# Patient Record
Sex: Male | Born: 1980 | Hispanic: Yes | Marital: Married | Smoking: Never smoker
Health system: Southern US, Community
[De-identification: ages and names within clinical notes are randomized; demographics above are authoritative.]

---

## 2021-05-29 ENCOUNTER — Other Ambulatory Visit: Payer: Self-pay

## 2021-05-29 ENCOUNTER — Encounter: Payer: Self-pay | Admitting: Intensive Care

## 2021-05-29 ENCOUNTER — Emergency Department
Admission: EM | Admit: 2021-05-29 | Discharge: 2021-05-29 | Disposition: A | Discharge status: Home / Self Care | Payer: Self-pay | Attending: Student in an Organized Health Care Education/Training Program | Admitting: Student in an Organized Health Care Education/Training Program

## 2021-05-29 ENCOUNTER — Other Ambulatory Visit: Payer: Self-pay | Admitting: Podiatry

## 2021-05-29 ENCOUNTER — Emergency Department: Payer: Self-pay

## 2021-05-29 DIAGNOSIS — M79672 Pain in left foot: Secondary | ICD-10-CM

## 2021-05-29 DIAGNOSIS — S91332A Puncture wound without foreign body, left foot, initial encounter: Secondary | ICD-10-CM | POA: Insufficient documentation

## 2021-05-29 DIAGNOSIS — Z23 Encounter for immunization: Secondary | ICD-10-CM | POA: Insufficient documentation

## 2021-05-29 DIAGNOSIS — W450XXA Nail entering through skin, initial encounter: Secondary | ICD-10-CM | POA: Insufficient documentation

## 2021-05-29 LAB — COMPREHENSIVE METABOLIC PANEL
ALT: 18 U/L (ref 0–44)
AST: 23 U/L (ref 15–41)
Albumin: 4.2 g/dL (ref 3.5–5.0)
Alkaline Phosphatase: 105 U/L (ref 38–126)
Anion gap: 6 (ref 5–15)
BUN: 18 mg/dL (ref 6–20)
CO2: 29 mmol/L (ref 22–32)
Calcium: 8.9 mg/dL (ref 8.9–10.3)
Chloride: 101 mmol/L (ref 98–111)
Creatinine, Ser: 0.96 mg/dL (ref 0.61–1.24)
GFR, Estimated: >60 mL/min (ref >60)
Glucose, Bld: 110 mg/dL — ABNORMAL HIGH (ref 70–99)
Potassium: 3.8 mmol/L (ref 3.5–5.1)
Sodium: 136 mmol/L (ref 135–145)
Total Bilirubin: 0.5 mg/dL (ref 0.3–1.2)
Total Protein: 8.1 g/dL (ref 6.5–8.1)

## 2021-05-29 LAB — CBC WITH DIFFERENTIAL/PLATELET
Abs Immature Granulocytes: 0.02 10*3/uL (ref 0.00–0.07)
Basophils Absolute: 0.1 10*3/uL (ref 0.0–0.1)
Basophils Relative: 1 %
Eosinophils Absolute: 0.3 10*3/uL (ref 0.0–0.5)
Eosinophils Relative: 4 %
HCT: 42.5 % (ref 39.0–52.0)
Hemoglobin: 14.8 g/dL (ref 13.0–17.0)
Immature Granulocytes: 0 %
Lymphocytes Relative: 32 %
Lymphs Abs: 2.8 10*3/uL (ref 0.7–4.0)
MCH: 30.9 pg (ref 26.0–34.0)
MCHC: 34.8 g/dL (ref 30.0–36.0)
MCV: 88.7 fL (ref 80.0–100.0)
Monocytes Absolute: 0.6 10*3/uL (ref 0.1–1.0)
Monocytes Relative: 7 %
Neutro Abs: 4.9 10*3/uL (ref 1.7–7.7)
Neutrophils Relative %: 56 %
Platelets: 361 10*3/uL (ref 150–400)
RBC: 4.79 MIL/uL (ref 4.22–5.81)
RDW: 11.7 % (ref 11.5–15.5)
WBC: 8.7 10*3/uL (ref 4.0–10.5)
nRBC: 0 % (ref 0.0–0.2)

## 2021-05-29 MED ORDER — DOXYCYCLINE HYCLATE 100 MG PO TABS: 100.0000 mg | ORAL_TABLET | Freq: Two times a day (BID) | ORAL | 0 refills | Status: AC

## 2021-05-29 MED ORDER — OXYCODONE-ACETAMINOPHEN 5-325 MG PO TABS
1.0000 | ORAL_TABLET | ORAL | Status: AC | PRN
  Administered 2021-05-29 (×2): 1 via ORAL
  Filled 2021-05-29 (×2): qty 1

## 2021-05-29 MED ORDER — DOXYCYCLINE HYCLATE 100 MG PO TABS
100.0000 mg | ORAL_TABLET | Freq: Once | ORAL | Status: AC
  Administered 2021-05-29: 100 mg via ORAL
  Filled 2021-05-29: qty 1

## 2021-05-29 MED ORDER — HYDROCODONE-ACETAMINOPHEN 5-325 MG PO TABS: 1.0000 | ORAL_TABLET | ORAL | 0 refills | Status: DC | PRN

## 2021-05-29 MED ORDER — CIPROFLOXACIN HCL 500 MG PO TABS: 500.0000 mg | ORAL_TABLET | Freq: Two times a day (BID) | ORAL | 0 refills | Status: AC

## 2021-05-29 MED ORDER — CIPROFLOXACIN HCL 500 MG PO TABS
500.0000 mg | ORAL_TABLET | Freq: Once | ORAL | Status: AC
  Administered 2021-05-29: 500 mg via ORAL
  Filled 2021-05-29: qty 1

## 2021-05-29 MED ORDER — TETANUS-DIPHTH-ACELL PERTUSSIS 5-2.5-18.5 LF-MCG/0.5 IM SUSY
0.5000 mL | PREFILLED_SYRINGE | Freq: Once | INTRAMUSCULAR | Status: AC
  Administered 2021-05-29: 0.5 mL via INTRAMUSCULAR
  Filled 2021-05-29: qty 0.5

## 2021-05-29 NOTE — ED Notes (Signed)
Interpreting system at bedside. Interpreter request placed as well.

## 2021-05-29 NOTE — ED Provider Notes (Signed)
[facility] Emergency Department Provider Note    Event Date/Time   First MD Initiated Contact with Patient 05/29/21 1834     (approximate)  I have reviewed the triage vital signs and the nursing notes.   HISTORY  Chief Complaint Foot Injury    HPI Stephen Lloyd is a 40 y.o. male presents to the ER for evaluation of several days of persistent left foot pain and swelling that occurred after he had a framing nail go into his foot 10 days ago went through boot.  States he tried to manage at home and actually took a few doses of amoxicillin.  States the pain was more severe a few days ago and is since improved but wanted to be evaluated because he is having persistent swelling.  Denies any fevers or chills no numbness or tingling.  Denies any history of diabetes.  History reviewed. No pertinent past medical history. History reviewed. No pertinent family history. History reviewed. No pertinent surgical history. There are no problems to display for this patient.     Prior to Admission medications   Medication Sig Start Date End Date Taking? Authorizing Provider  ciprofloxacin (CIPRO) 500 MG tablet Take 1 tablet (500 mg total) by mouth 2 (two) times daily for 10 days. 05/29/21 06/08/21 Yes Willy Eddy, MD  doxycycline (VIBRA-TABS) 100 MG tablet Take 1 tablet (100 mg total) by mouth 2 (two) times daily for 10 days. 05/29/21 06/08/21 Yes Willy Eddy, MD  HYDROcodone-acetaminophen (NORCO) 5-325 MG tablet Take 1 tablet by mouth every 4 (four) hours as needed for moderate pain. 05/29/21  Yes Willy Eddy, MD    Allergies Patient has no known allergies.    Social History Social History   Tobacco Use   Smoking status: Never   Smokeless tobacco: Never  Substance Use Topics   Alcohol use: Never   Drug use: Never    Review of Systems Patient denies headaches, rhinorrhea, blurry vision, numbness, shortness of breath, chest pain,  edema, cough, abdominal pain, nausea, vomiting, diarrhea, dysuria, fevers, rashes or hallucinations unless otherwise stated above in HPI. ____________________________________________   PHYSICAL EXAM:  VITAL SIGNS: Vitals:   05/29/21 1855 05/29/21 1930  BP: (!) 113/91 109/87  Pulse: 72 65  Resp: 20   Temp:    SpO2: 98% 100%    Constitutional: Alert and oriented.  Eyes: Conjunctivae are normal.  Head: Atraumatic. Nose: No congestion/rhinnorhea. Mouth/Throat: Mucous membranes are moist.   Neck: No stridor. Painless ROM.  Cardiovascular: Normal rate, regular rhythm. Grossly normal heart sounds.  Good peripheral circulation. Respiratory: Normal respiratory effort.  No retractions. Lungs CTAB. Gastrointestinal: Soft and nontender. No distention. No abdominal bruits. No CVA tenderness. Genitourinary:  Musculoskeletal: Does have swelling and bruising to the midfoot of the left foot.  DP and PT are palpable.  Neurovascular intact distally.  Slightly warm but not overtly erythematous or cellulitic appearing no crepitus.  No blistering.  Does have small puncture wound on the plantar surface of midfoot without any purulence or surrounding erythema.  Neurologic:  Normal speech and language. No gross focal neurologic deficits are appreciated. No facial droop Skin:  Skin is warm, dry and intact. No rash noted. Psychiatric: Mood and affect are normal. Speech and behavior are normal.  ____________________________________________   LABS (all labs ordered are listed, but only abnormal results are displayed)  Results for orders placed or performed during the hospital encounter of 05/29/21 (from the past 24 hour(s))  CBC with Differential  Status: None   Collection Time: 05/29/21  6:09 PM  Result Value Ref Range   WBC 8.7 4.0 - 10.5 K/uL   RBC 4.79 4.22 - 5.81 MIL/uL   Hemoglobin 14.8 13.0 - 17.0 g/dL   HCT 58.5 27.7 - 82.4 %   MCV 88.7 80.0 - 100.0 fL   MCH 30.9 26.0 - 34.0 pg   MCHC 34.8  30.0 - 36.0 g/dL   RDW 23.5 36.1 - 44.3 %   Platelets 361 150 - 400 K/uL   nRBC 0.0 0.0 - 0.2 %   Neutrophils Relative % 56 %   Neutro Abs 4.9 1.7 - 7.7 K/uL   Lymphocytes Relative 32 %   Lymphs Abs 2.8 0.7 - 4.0 K/uL   Monocytes Relative 7 %   Monocytes Absolute 0.6 0.1 - 1.0 K/uL   Eosinophils Relative 4 %   Eosinophils Absolute 0.3 0.0 - 0.5 K/uL   Basophils Relative 1 %   Basophils Absolute 0.1 0.0 - 0.1 K/uL   Immature Granulocytes 0 %   Abs Immature Granulocytes 0.02 0.00 - 0.07 K/uL  Comprehensive metabolic panel     Status: Abnormal   Collection Time: 05/29/21  6:09 PM  Result Value Ref Range   Sodium 136 135 - 145 mmol/L   Potassium 3.8 3.5 - 5.1 mmol/L   Chloride 101 98 - 111 mmol/L   CO2 29 22 - 32 mmol/L   Glucose, Bld 110 (H) 70 - 99 mg/dL   BUN 18 6 - 20 mg/dL   Creatinine, Ser 1.54 0.61 - 1.24 mg/dL   Calcium 8.9 8.9 - 00.8 mg/dL   Total Protein 8.1 6.5 - 8.1 g/dL   Albumin 4.2 3.5 - 5.0 g/dL   AST 23 15 - 41 U/L   ALT 18 0 - 44 U/L   Alkaline Phosphatase 105 38 - 126 U/L   Total Bilirubin 0.5 0.3 - 1.2 mg/dL   GFR, Estimated >67 >61 mL/min   Anion gap 6 5 - 15   ____________________________________________  EKG____________________________________________  RADIOLOGY  I personally reviewed all radiographic images ordered to evaluate for the above acute complaints and reviewed radiology reports and findings.  These findings were personally discussed with the patient.  Please see medical record for radiology report.  ____________________________________________   PROCEDURES  Procedure(s) performed:  Procedures    Critical Care performed: no ____________________________________________   INITIAL IMPRESSION / ASSESSMENT AND PLAN / ED COURSE  Pertinent labs & imaging results that were available during my care of the patient were reviewed by me and considered in my medical decision making (see chart for details).   DDX: Osteo-, cellulitis,  fracture, foreign body  Stephen Lloyd is a 11 y.o. who presents to the ED with presentation as described above.  Patient well-appearing afebrile hemodynamically stable with reassuring blood work 10 days after puncture wound to left foot.  X-ray shows possible osteomyelitis versus bony destruction secondary to traumatic injury to the left foot.  He is not showing any clinical signs of cellulitis or acute infection.  I discussed the case in consultation with podiatry, Dr.Patel.  As he is not septic with no SIRS markers does appear to be a candidate for trial of outpatient management.  Will be placed on antibiotics with pain control.  Discussed strict return precautions and follow-up with podiatry patient agreeable to plan.     The patient was evaluated in Emergency Department today for the symptoms described in the history of present illness. He/she was evaluated  in the context of the global COVID-19 pandemic, which necessitated consideration that the patient might be at risk for infection with the SARS-CoV-2 virus that causes COVID-19. Institutional protocols and algorithms that pertain to the evaluation of patients at risk for COVID-19 are in a state of rapid change based on information released by regulatory bodies including the CDC and federal and state organizations. These policies and algorithms were followed during the patient's care in the ED.  As part of my medical decision making, I reviewed the following data within the electronic MEDICAL RECORD NUMBER Nursing notes reviewed and incorporated, Labs reviewed, notes from prior ED visits and [place] Controlled Substance Database   ____________________________________________   FINAL CLINICAL IMPRESSION(S) / ED DIAGNOSES  Final diagnoses:  Acute foot pain, left  Puncture wound of left foot, initial encounter      NEW MEDICATIONS STARTED DURING THIS VISIT:  New Prescriptions   CIPROFLOXACIN (CIPRO) 500 MG TABLET    Take 1 tablet (500 mg  total) by mouth 2 (two) times daily for 10 days.   DOXYCYCLINE (VIBRA-TABS) 100 MG TABLET    Take 1 tablet (100 mg total) by mouth 2 (two) times daily for 10 days.   HYDROCODONE-ACETAMINOPHEN (NORCO) 5-325 MG TABLET    Take 1 tablet by mouth every 4 (four) hours as needed for moderate pain.     Note:  This document was prepared using Dragon voice recognition software and may include unintentional dictation errors.    Willy Eddy, MD 05/29/21 1949

## 2021-05-29 NOTE — ED Triage Notes (Signed)
Interpretor used during triage. Patient stepped on nail with left foot and went through his shoe X10 days ago. His foot has swelling and some discoloration. Patient reports he took some leftover amoxicillin but it hasnt gotten better. Patient also c/o pain in foot

## 2021-11-01 ENCOUNTER — Other Ambulatory Visit: Payer: Self-pay

## 2021-11-01 ENCOUNTER — Encounter: Payer: Self-pay | Admitting: Emergency Medicine

## 2021-11-01 ENCOUNTER — Emergency Department
Admission: EM | Admit: 2021-11-01 | Discharge: 2021-11-01 | Disposition: A | Discharge status: Home / Self Care | Payer: Self-pay | Attending: Emergency Medicine | Admitting: Emergency Medicine

## 2021-11-01 DIAGNOSIS — L739 Follicular disorder, unspecified: Secondary | ICD-10-CM | POA: Insufficient documentation

## 2021-11-01 DIAGNOSIS — R21 Rash and other nonspecific skin eruption: Secondary | ICD-10-CM

## 2021-11-01 MED ORDER — DOXYCYCLINE HYCLATE 100 MG PO TABS: 100.0000 mg | ORAL_TABLET | Freq: Two times a day (BID) | ORAL | 0 refills | Status: DC

## 2021-11-01 NOTE — ED Provider Notes (Signed)
? ?  [facility] ?Provider Note ? ? ? Event Date/Time  ? First MD Initiated Contact with Patient 11/01/21 [phone]   ?  (approximate) ? ? ?History  ? ?Rash ? ?Spanish interpreter used ?HPI ? ?Stephen Lloyd is a 41 y.o. male with no significant past medical history presents with complaints of a rash.  Patient reports that he frequently gets pimples on his neck along his hairline and on his face.  Occasionally gets them on his back as well.  He reports he took amoxicillin that he got from a [place] which did not help ?  ? ? ?Physical Exam  ? ?Triage Vital Signs: ?ED Triage Vitals  ?Enc Vitals Group  ?   BP 11/01/21 0907 130/80  ?   Pulse Rate 11/01/21 0907 63  ?   Resp 11/01/21 0907 18  ?   Temp 11/01/21 0907 98.7 ?F (37.1 ?C)  ?   Temp Source 11/01/21 0907 Oral  ?   SpO2 11/01/21 0907 97 %  ?   Weight --   ?   Height 11/01/21 0908 1.71 m (5' 7.32")  ?   Head Circumference --   ?   Peak Flow --   ?   Pain Score 11/01/21 0907 8  ?   Pain Loc --   ?   Pain Edu? --   ?   Excl. in [place]? --   ? ? ?Most recent vital signs: ?Vitals:  ? 11/01/21 0907  ?BP: 130/80  ?Pulse: 63  ?Resp: 18  ?Temp: 98.7 ?F (37.1 ?C)  ?SpO2: 97%  ? ? ? ?General: Awake, no distress.  ?CV:  Good peripheral perfusion.  ?Resp:  Normal effort.  ?Abd:  No distention.  ?Other:  Several small pimple/small abscess-like lesions to the face, upper back and hairline of the posterior neck ? ? ?ED Results / Procedures / Treatments  ? ?Labs ?(all labs ordered are listed, but only abnormal results are displayed) ?Labs Reviewed - No data to display ? ? ?EKG ? ? ? ? ?RADIOLOGY ? ? ? ? ?PROCEDURES: ? ?Critical Care performed:  ? ?Procedures ? ? ?MEDICATIONS ORDERED IN ED: ?Medications - No data to display ? ? ?IMPRESSION / MDM / ASSESSMENT AND PLAN / ED COURSE  ?I reviewed the triage vital signs and the nursing notes. ? ? ?I suspect folliculitis based on exam.  We will start the patient on 10 days of doxycycline to see if this helps,  outpatient follow-up with PCP recommended. ? ? ? ? ?  ? ? ?FINAL CLINICAL IMPRESSION(S) / ED DIAGNOSES  ? ?Final diagnoses:  ?Rash  ?Folliculitis  ? ? ? ?Rx / DC Orders  ? ?ED Discharge Orders   ? ?      Ordered  ?  doxycycline (VIBRA-TABS) 100 MG tablet  2 times daily       ? 11/01/21 0936  ? ?  ?  ? ?  ? ? ? ?Note:  This document was prepared using Dragon voice recognition software and may include unintentional dictation errors. ?  ?Lavonia Drafts, MD ?11/01/21 1033 ? ?

## 2021-11-01 NOTE — ED Triage Notes (Signed)
Pt to ED via POV with c/o rash to face and neck for the last 5 months they have been coming and going. It does itching.  ?

## 2023-02-23 IMAGING — DX DG FOOT COMPLETE 3+V*L*
3 series · 3 of 3 positions shown · non-contrast
Comparison: None.

CLINICAL DATA: injury/swelling/discoloration. Stepped on nail 10
days ago.

EXAM:
LEFT FOOT - COMPLETE 3+ VIEW

[foot ap]
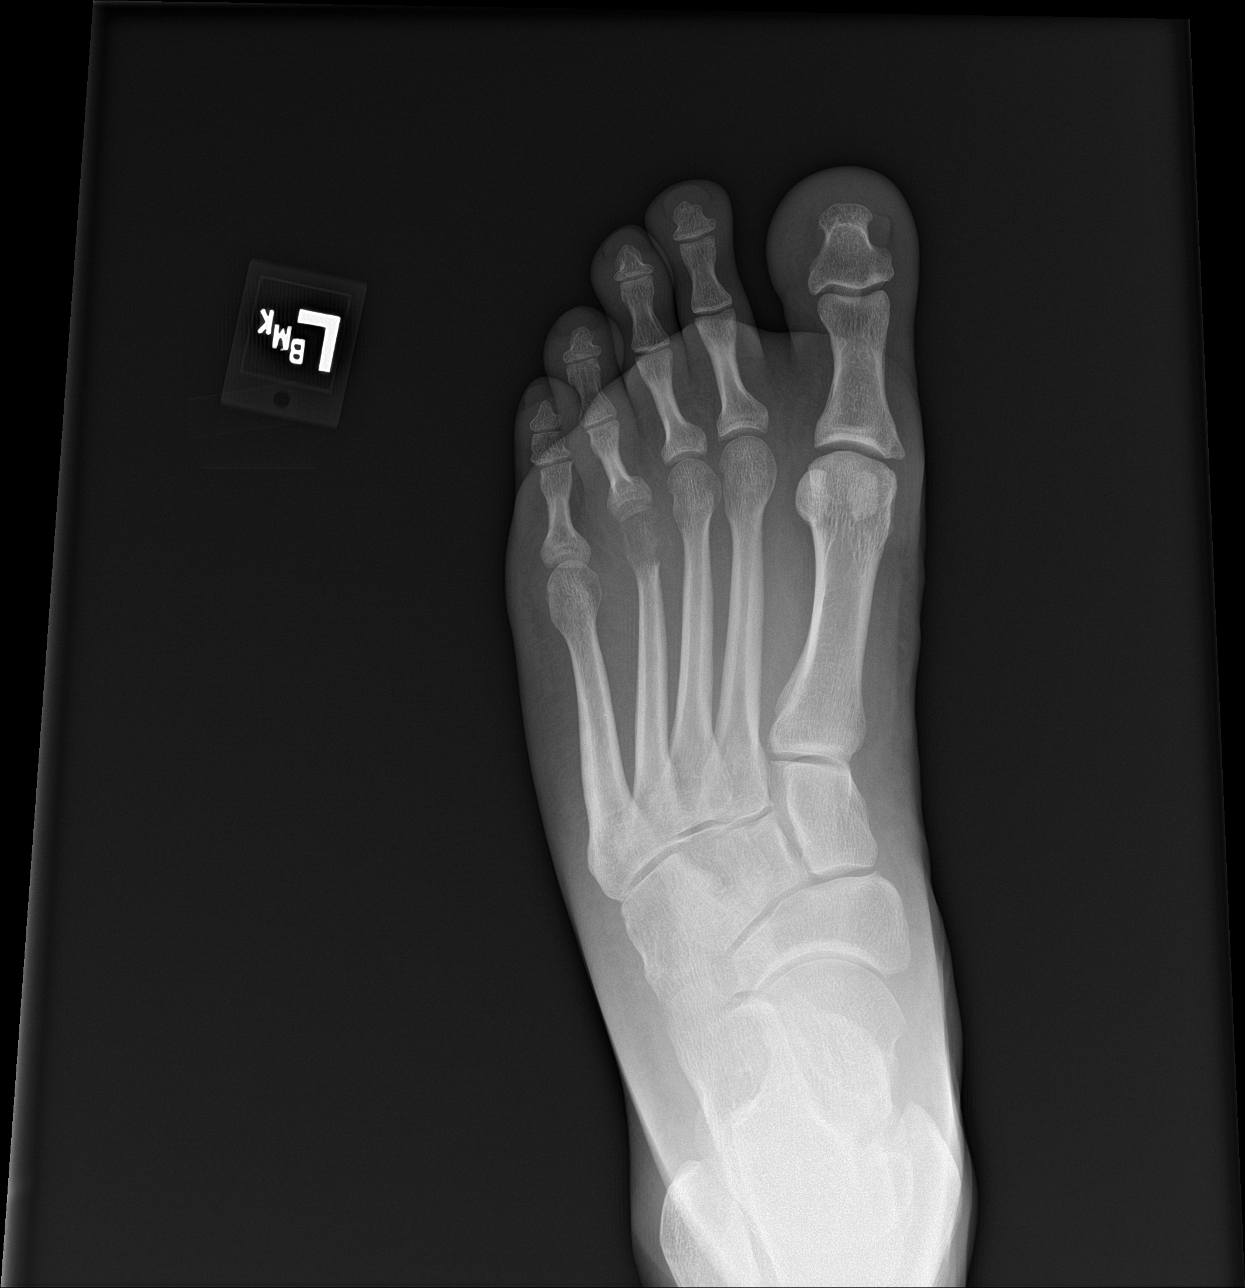

[foot obl]
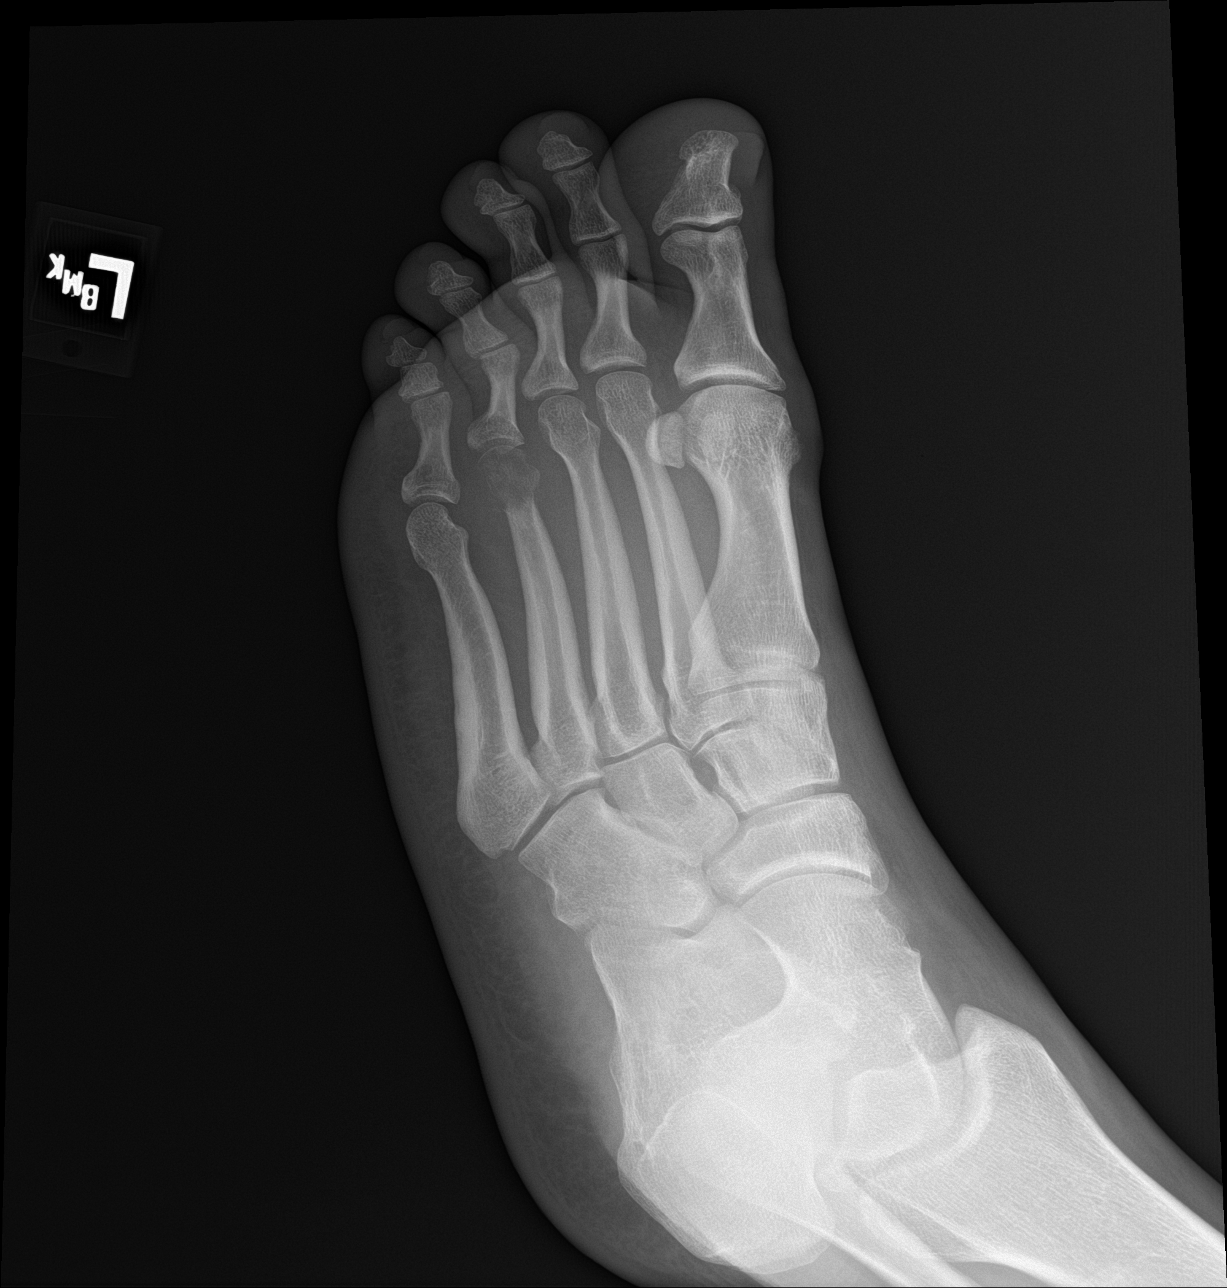

[foot lat]
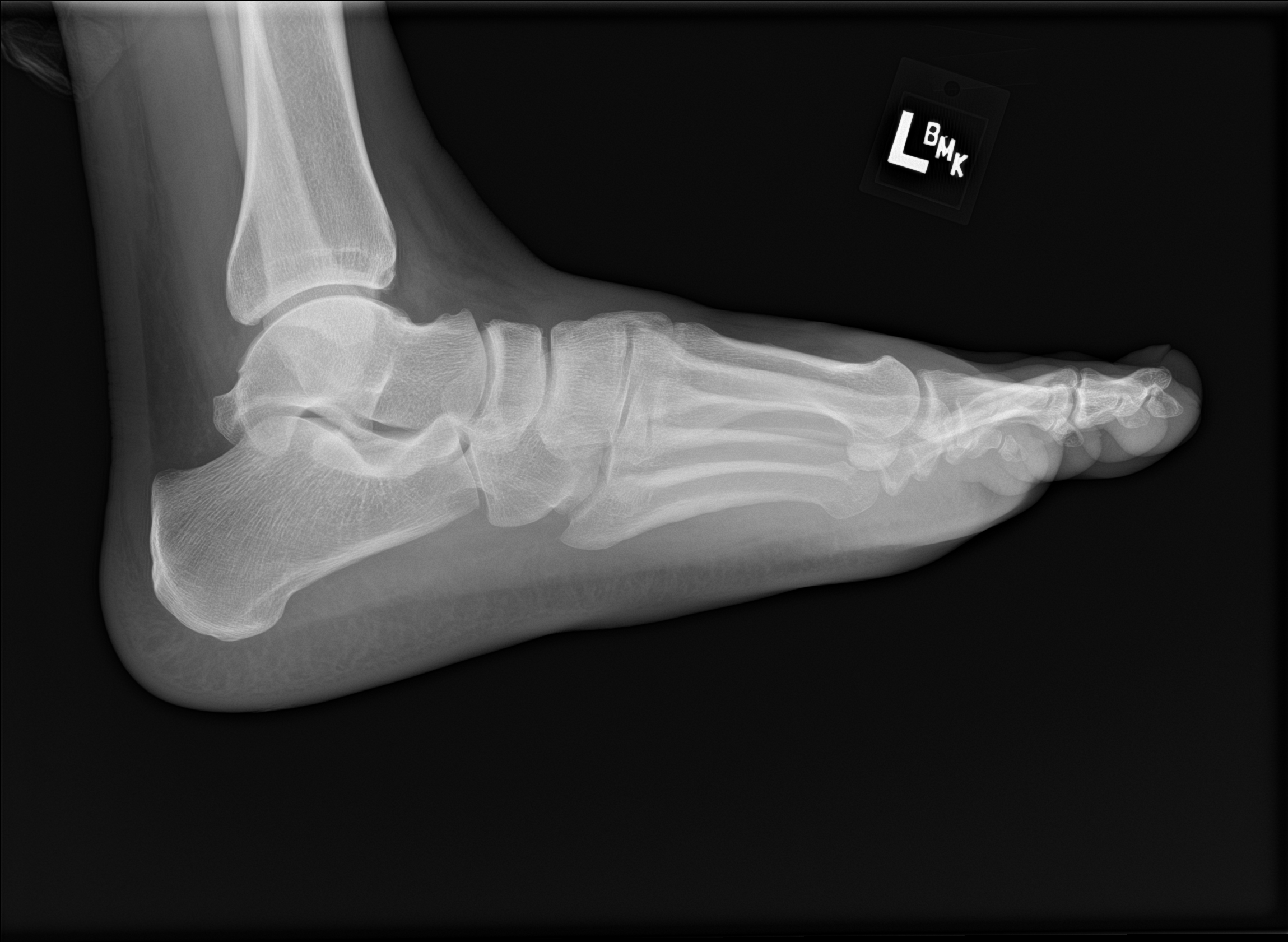

[3 of 3 positions shown; findings below may reference images not displayed]

FINDINGS: There is lucency within the left 4th metatarsal head. This is
concerning for osteomyelitis. No fracture, subluxation or
dislocation. No radiopaque foreign bodies.
IMPRESSION: Lucency in the left 4th metatarsal head concerning for
osteomyelitis.

## 2023-06-17 ENCOUNTER — Other Ambulatory Visit: Payer: Self-pay

## 2023-06-17 ENCOUNTER — Emergency Department
Admission: EM | Admit: 2023-06-17 | Discharge: 2023-06-17 | Disposition: A | Discharge status: Home / Self Care | Payer: Self-pay | Attending: Emergency Medicine | Admitting: Emergency Medicine

## 2023-06-17 DIAGNOSIS — L700 Acne vulgaris: Secondary | ICD-10-CM | POA: Insufficient documentation

## 2023-06-17 LAB — CBC WITH DIFFERENTIAL/PLATELET
Abs Immature Granulocytes: 0.01 10*3/uL (ref 0.00–0.07)
Basophils Absolute: 0 10*3/uL (ref 0.0–0.1)
Basophils Relative: 0 %
Eosinophils Absolute: 0.7 10*3/uL — ABNORMAL HIGH (ref 0.0–0.5)
Eosinophils Relative: 7 %
HCT: 44 % (ref 39.0–52.0)
Hemoglobin: 14.8 g/dL (ref 13.0–17.0)
Immature Granulocytes: 0 %
Lymphocytes Relative: 24 %
Lymphs Abs: 2.4 10*3/uL (ref 0.7–4.0)
MCH: 30.4 pg (ref 26.0–34.0)
MCHC: 33.6 g/dL (ref 30.0–36.0)
MCV: 90.3 fL (ref 80.0–100.0)
Monocytes Absolute: 0.9 10*3/uL (ref 0.1–1.0)
Monocytes Relative: 9 %
Neutro Abs: 5.7 10*3/uL (ref 1.7–7.7)
Neutrophils Relative %: 60 %
Platelets: 367 10*3/uL (ref 150–400)
RBC: 4.87 MIL/uL (ref 4.22–5.81)
RDW: 11.7 % (ref 11.5–15.5)
WBC: 9.8 10*3/uL (ref 4.0–10.5)
nRBC: 0 % (ref 0.0–0.2)

## 2023-06-17 LAB — COMPREHENSIVE METABOLIC PANEL
ALT: 20 U/L (ref 0–44)
AST: 20 U/L (ref 15–41)
Albumin: 4.2 g/dL (ref 3.5–5.0)
Alkaline Phosphatase: 99 U/L (ref 38–126)
Anion gap: 8 (ref 5–15)
BUN: 22 mg/dL — ABNORMAL HIGH (ref 6–20)
CO2: 27 mmol/L (ref 22–32)
Calcium: 9.1 mg/dL (ref 8.9–10.3)
Chloride: 102 mmol/L (ref 98–111)
Creatinine, Ser: 0.94 mg/dL (ref 0.61–1.24)
GFR, Estimated: >60 mL/min (ref >60)
Glucose, Bld: 109 mg/dL — ABNORMAL HIGH (ref 70–99)
Potassium: 4.3 mmol/L (ref 3.5–5.1)
Sodium: 137 mmol/L (ref 135–145)
Total Bilirubin: 0.4 mg/dL (ref 0.3–1.2)
Total Protein: 7.8 g/dL (ref 6.5–8.1)

## 2023-06-17 MED ORDER — DOXYCYCLINE HYCLATE 100 MG PO TABS
100.0000 mg | ORAL_TABLET | Freq: Once | ORAL | Status: AC
  Administered 2023-06-17: 100 mg via ORAL
  Filled 2023-06-17: qty 1

## 2023-06-17 MED ORDER — DOXYCYCLINE HYCLATE 100 MG PO TABS: 100.0000 mg | ORAL_TABLET | Freq: Two times a day (BID) | ORAL | 0 refills | Status: AC

## 2023-06-17 NOTE — Discharge Instructions (Addendum)
Your blood work is normal at this time.  Take the prescription antibiotic twice a day until all tablets are gone.  Avoid manipulating squeezing poking or picking at your pimples.  You should follow-up with one of the community clinics for ongoing care and management.  Sus anlisis de sangre son normales en este momento.  Tome el antibitico [employer] veces al da hasta que se acaben todas las tabletas.  Evita manipular, apretar, pinchar o pellizcar tus granos.  Debe hacer un seguimiento con una de las clnicas comunitarias para recibir atencin y [place] continuos.

## 2023-06-17 NOTE — ED Provider Notes (Signed)
[facility] Emergency Department Provider Note     Event Date/Time   First MD Initiated Contact with Patient 06/17/23 1539     (approximate)   History   Abscess   HPI  History limited by Spanish language.  Interpreted [place] [facility].) present for interview and exam.    Stephen Lloyd is a 42 y.o. male presents to the ED for recurrence of generalized vesicular rash.  He reports bumps on his face and neck.  He reports they have been persistent for several months.  He will sometimes pop the pustules and reports of scant purulent drainage.  He denies any fevers, chills, or sweats.  Denies any known contacts or exposures.   Physical Exam   Triage Vital Signs: ED Triage Vitals  Encounter Vitals Group     BP 06/17/23 1508 115/82     Systolic BP Percentile --      Diastolic BP Percentile --      Pulse Rate 06/17/23 1508 80     Resp 06/17/23 1508 18     Temp 06/17/23 1508 98 F (36.7 C)     Temp src --      SpO2 06/17/23 1508 100 %     Weight --      Height --      Head Circumference --      Peak Flow --      Pain Score 06/17/23 1507 10     Pain Loc --      Pain Education --      Exclude from Growth Chart --     Most recent vital signs: Vitals:   06/17/23 1508  BP: 115/82  Pulse: 80  Resp: 18  Temp: 98 F (36.7 C)  SpO2: 100%    General Awake, no distress. NAD HEENT NCAT. PERRL. EOMI. No rhinorrhea. Mucous membranes are moist.  CV:  Good peripheral perfusion.  RESP:  Normal effort.  ABD:  No distention.  SKIN:   Patient with multiple cystic acne lesions noted to his beard area, the nape of his neck at his hairline, and his upper anterior and posterior torso.   ED Results / Procedures / Treatments   Labs (all labs ordered are listed, but only abnormal results are displayed) Labs Reviewed  COMPREHENSIVE METABOLIC PANEL - Abnormal; Notable for the following components:      Result Value   Glucose, Bld 109 (*)    BUN 22 (*)    All  other components within normal limits  CBC WITH DIFFERENTIAL/PLATELET - Abnormal; Notable for the following components:   Eosinophils Absolute 0.7 (*)    All other components within normal limits     EKG   RADIOLOGY   PROCEDURES:  Critical Care performed: No  Procedures   MEDICATIONS ORDERED IN ED: Medications  doxycycline (VIBRA-TABS) tablet 100 mg (100 mg Oral Given 06/17/23 1742)     IMPRESSION / MDM / ASSESSMENT AND PLAN / ED COURSE  I reviewed the triage vital signs and the nursing notes.                              Differential diagnosis includes, but is not limited to, folliculitis, contact dermatitis, tinea,   Patient's presentation is most consistent with acute, uncomplicated illness.  Patient's diagnosis is consistent with cystic acne. Patient will be discharged home with prescriptions for doxycycline.  Patient is also given Chlorohex soap to use as  a body wash and advised to pick up an over-the-counter or free facial cleanser as demonstrated.  Patient is to follow up with the local community clinics as discussed, as needed or otherwise directed. Patient is given ED precautions to return to the ED for any worsening or new symptoms.   FINAL CLINICAL IMPRESSION(S) / ED DIAGNOSES   Final diagnoses:  Cystic acne     Rx / DC Orders   ED Discharge Orders          Ordered    doxycycline (VIBRA-TABS) 100 MG tablet  2 times daily        06/17/23 1653             Note:  This document was prepared using Dragon voice recognition software and may include unintentional dictation errors.    Lissa Hoard, PA-C 06/18/23 2356    Chesley Noon, MD 06/19/23 [phone]

## 2023-06-17 NOTE — ED Triage Notes (Signed)
Pt comes with c/o bumps all over face and neck. Pt states this has been going on for awhile. Pt states also on chest and upper back. Pt states he pops them and stuff does come out. Pt states some on top of head too.

## 2023-06-20 ENCOUNTER — Telehealth: Payer: Self-pay

## 2023-06-20 NOTE — Telephone Encounter (Signed)
Received ED referral. Called pt to give info on clinic. Vmail not set up

## 2024-02-18 ENCOUNTER — Emergency Department: Payer: Self-pay

## 2024-02-18 ENCOUNTER — Other Ambulatory Visit: Payer: Self-pay

## 2024-02-18 ENCOUNTER — Encounter: Payer: Self-pay | Admitting: Emergency Medicine

## 2024-02-18 ENCOUNTER — Emergency Department
Admission: EM | Admit: 2024-02-18 | Discharge: 2024-02-18 | Disposition: A | Discharge status: Home / Self Care | Payer: Self-pay | Attending: Emergency Medicine | Admitting: Emergency Medicine

## 2024-02-18 DIAGNOSIS — R451 Restlessness and agitation: Secondary | ICD-10-CM | POA: Insufficient documentation

## 2024-02-18 LAB — CBC
HCT: 44.5 % (ref 39.0–52.0)
Hemoglobin: 15.7 g/dL (ref 13.0–17.0)
MCH: 31 pg (ref 26.0–34.0)
MCHC: 35.3 g/dL (ref 30.0–36.0)
MCV: 87.8 fL (ref 80.0–100.0)
Platelets: 371 K/uL (ref 150–400)
RBC: 5.07 MIL/uL (ref 4.22–5.81)
RDW: 12.2 % (ref 11.5–15.5)
WBC: 13.8 K/uL — ABNORMAL HIGH (ref 4.0–10.5)
nRBC: 0 % (ref 0.0–0.2)

## 2024-02-18 LAB — COMPREHENSIVE METABOLIC PANEL WITH GFR
ALT: 24 U/L (ref 0–44)
AST: 45 U/L — ABNORMAL HIGH (ref 15–41)
Albumin: 4.6 g/dL (ref 3.5–5.0)
Alkaline Phosphatase: 82 U/L (ref 38–126)
Anion gap: 16 — ABNORMAL HIGH (ref 5–15)
BUN: 28 mg/dL — ABNORMAL HIGH (ref 6–20)
CO2: 20 mmol/L — ABNORMAL LOW (ref 22–32)
Calcium: 9.5 mg/dL (ref 8.9–10.3)
Chloride: 104 mmol/L (ref 98–111)
Creatinine, Ser: 1.65 mg/dL — ABNORMAL HIGH (ref 0.61–1.24)
GFR, Estimated: 53 mL/min — ABNORMAL LOW (ref >60)
Glucose, Bld: 106 mg/dL — ABNORMAL HIGH (ref 70–99)
Potassium: 3.3 mmol/L — ABNORMAL LOW (ref 3.5–5.1)
Sodium: 140 mmol/L (ref 135–145)
Total Bilirubin: 1.2 mg/dL (ref 0.0–1.2)
Total Protein: 7.9 g/dL (ref 6.5–8.1)

## 2024-02-18 LAB — ETHANOL: Alcohol, Ethyl (B): <15 mg/dL (ref <15)

## 2024-02-18 LAB — ACETAMINOPHEN LEVEL: Acetaminophen (Tylenol), Serum: <10 ug/mL — ABNORMAL LOW (ref 10–30)

## 2024-02-18 LAB — TROPONIN I (HIGH SENSITIVITY)
Troponin I (High Sensitivity): 21 ng/L — ABNORMAL HIGH (ref <18)
Troponin I (High Sensitivity): 28 ng/L — ABNORMAL HIGH (ref <18)

## 2024-02-18 LAB — CK: Total CK: 915 U/L — ABNORMAL HIGH (ref 49–397)

## 2024-02-18 LAB — SALICYLATE LEVEL: Salicylate Lvl: <7 mg/dL — ABNORMAL LOW (ref 7.0–30.0)

## 2024-02-18 MED ORDER — SODIUM CHLORIDE 0.9 % IV BOLUS
1000.0000 mL | Freq: Once | INTRAVENOUS | Status: AC
  Administered 2024-02-18: 1000 mL via INTRAVENOUS

## 2024-02-18 MED ORDER — DROPERIDOL 2.5 MG/ML IJ SOLN
2.5000 mg | Freq: Once | INTRAMUSCULAR | Status: AC
  Administered 2024-02-18: 2.5 mg via INTRAMUSCULAR

## 2024-02-18 NOTE — ED Triage Notes (Signed)
 Patient to ED via [facility] with PD for being combative. PD initially called out due to pt rolling around on the sidewalk- unable to answer questions with PD. Pt combative- kicking and spitting with EMS. Given 2mg  of Versed with EMS.  PT spanish speaking.   107/71 95 HR 93% RA

## 2024-02-18 NOTE — ED Notes (Addendum)
 Pt's brother in room visiting. Brother reports hx of cocaine and meth use with similar behaviors. States family has not seen him in the past 2 months due to same. Brother states he did not want to stay but could be called for ride home. Brother is emergency contact.

## 2024-02-18 NOTE — Discharge Instructions (Signed)
 Your laboratory workup shows signs of dehydration today.  Please make sure you are eating and drinking enough of the neck several days.  Please do not continue to use any illegal substances such as cocaine or amphetamines.

## 2024-02-18 NOTE — ED Notes (Signed)
 This RN attempted to call pt's brother again. No answer at this time

## 2024-02-18 NOTE — ED Provider Notes (Signed)
 [facility] Provider Note    Event Date/Time   First MD Initiated Contact with Patient 02/18/24 1400     (approximate)   History   Aggressive Behavior  Spanish translator utilized HPI  Stephen Lloyd is a 43 y.o. male presents the emergency department after being found down with aggressive behavior.  Bystander called 911 for altered mental status.  Per EMS report he was found laying on the sidewalk and was combative, spitting and hitting.  Given IM Versed prior to arrival.  Unknown history.  Patient unable to give any further history.  Called brother, [name], reports history of drug use but uncertain what drugs.  Also endorses alcohol use.  No known seizure disorder.  Does endorse cocaine and methamphetamine use        Physical Exam   Triage Vital Signs: ED Triage Vitals [02/18/24 1402]  Encounter Vitals Group     BP      Girls Systolic BP Percentile      Girls Diastolic BP Percentile      Boys Systolic BP Percentile      Boys Diastolic BP Percentile      Pulse      Resp      Temp      Temp src      SpO2      Weight 138 lb 14.2 oz (63 kg)     Height 5' 7 (1.702 m)     Head Circumference      Peak Flow      Pain Score      Pain Loc      Pain Education      Exclude from Growth Chart     Most recent vital signs: Vitals:   02/18/24 1530 02/18/24 1630  BP: 107/69 105/78  Pulse: 72 71  Resp: 13 12  Temp:    SpO2: 99% 100%    Physical Exam Constitutional:      Appearance: He is well-developed.     Comments: Covered in dirt, disheveled, rolling around punching and hitting  HENT:     Head: Atraumatic.  Eyes:     Conjunctiva/sclera: Conjunctivae normal.  Cardiovascular:     Rate and Rhythm: Regular rhythm.  Pulmonary:     Effort: No respiratory distress.  Abdominal:     Tenderness: There is no abdominal tenderness.  Musculoskeletal:        General: Normal range of motion.     Cervical back: Normal range of motion.      Right lower leg: No edema.     Left lower leg: No edema.  Skin:    General: Skin is warm.     Capillary Refill: Capillary refill takes less than 2 seconds.  Neurological:     Mental Status: He is disoriented.     IMPRESSION / MDM / ASSESSMENT AND PLAN / ED COURSE  I reviewed the triage vital signs and the nursing notes.  Differential diagnosis including dysrhythmia, alcohol intoxication, intracranial hemorrhage, substance use, electrolyte disorder.   Requiring IM droperidol  on arrival given agitation and security at bedside  EKG  I, Clotilda Punter, the attending physician, personally viewed and interpreted this ECG.   Rate: Normal  Rhythm: Normal sinus  Axis: Normal  Intervals: Normal  ST&T Change: None  No tachycardic or bradycardic dysrhythmias while on cardiac telemetry.  RADIOLOGY I independently reviewed imaging, my interpretation of imaging: No signs of intracranial hemorrhage  LABS (all labs ordered are listed, but only abnormal  results are displayed) Labs interpreted as -    Labs Reviewed  CBC - Abnormal; Notable for the following components:      Result Value   WBC 13.8 (*)    All other components within normal limits  COMPREHENSIVE METABOLIC PANEL WITH GFR - Abnormal; Notable for the following components:   Potassium 3.3 (*)    CO2 20 (*)    Glucose, Bld 106 (*)    BUN 28 (*)    Creatinine, Ser 1.65 (*)    AST 45 (*)    GFR, Estimated 53 (*)    Anion gap 16 (*)    All other components within normal limits  SALICYLATE LEVEL - Abnormal; Notable for the following components:   Salicylate Lvl <7.0 (*)    All other components within normal limits  ACETAMINOPHEN  LEVEL - Abnormal; Notable for the following components:   Acetaminophen  (Tylenol ), Serum <10 (*)    All other components within normal limits  CK - Abnormal; Notable for the following components:   Total CK 915 (*)    All other components within normal limits  TROPONIN I (HIGH SENSITIVITY) -  Abnormal; Notable for the following components:   Troponin I (High Sensitivity) 21 (*)    All other components within normal limits  ETHANOL  URINE DRUG SCREEN, QUALITATIVE ([facility] ONLY)  POC OCCULT BLOOD, ED  TROPONIN I (HIGH SENSITIVITY)     MDM   Clinical Course as of 02/18/24 1635  Sun Feb 18, 2024  1632 Family member at bedside now. 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 Will get 2nd troponin to rule out any cardiac injury.  [DW]    Clinical Course User Index [DW] Malvina Alm DASEN, MD   Leukocytosis of 13.8.  No significant anemia.  Creatinine appears elevated at 1.6, last creatinine was over a year ago at 0.9.  Does have a mildly elevated BUN.  Potassium mildly low at 3.3.  Normal glucose.  Troponin mildly elevated at 21.  Alcohol level undetectable CT scan of the head with no signs of intracranial hemorrhage.  Added on CK given his elevated creatinine.  Given IV fluids.  Called family members.  PROCEDURES:  Critical Care performed: yes  .Critical Care  Performed by: Suzanne Kirsch, MD Authorized by: Suzanne Kirsch, MD   Critical care provider statement:    Critical care time (minutes):  30   Critical care time was exclusive of:  Separately billable procedures and treating other patients   Critical care was necessary to treat or prevent imminent or life-threatening deterioration of the following conditions:  [place]   Critical care was time spent personally by me on the following activities:  Development of treatment plan with patient or surrogate, discussions with consultants, evaluation of patient's response to treatment, examination of patient, ordering and review of laboratory studies, ordering and review of radiographic studies, ordering and performing treatments and interventions, pulse oximetry, re-evaluation of patient's condition and review of old charts   Patient's presentation is most consistent with acute presentation with potential threat to life  or bodily function.   MEDICATIONS ORDERED IN ED: Medications  droperidol  (INAPSINE ) 2.5 MG/ML injection 2.5 mg (2.5 mg Intramuscular Given 02/18/24 1406)  sodium chloride  0.9 % bolus 1,000 mL (1,000 mLs Intravenous New Bag/Given 02/18/24 1545)    FINAL CLINICAL IMPRESSION(S) / ED DIAGNOSES   Final diagnoses:  Agitation     Rx / DC Orders   ED Discharge Orders     None  Note:  This document was prepared using Dragon voice recognition software and may include unintentional dictation errors.   Suzanne Kirsch, MD 02/18/24 [phone]

## 2024-02-18 NOTE — ED Provider Notes (Signed)
  Physical Exam  BP 107/69   Pulse 72   Temp 97.6 F (36.4 C) (Axillary)   Resp 13   Ht 5' 7 (1.702 m)   Wt 63 kg   SpO2 99%   BMI 21.75 kg/m   Physical Exam  Procedures  Procedures  ED Course / MDM   Clinical Course as of 02/18/24 2142  [place] Feb 18, 2024  1632 Family member at bedside now. 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 Will get 2nd troponin to rule out any cardiac injury.  [DW]  1707 Troponin I (High Sensitivity)(!): 28 No significant delta change and no chest pain. No indication to continue to trend. Likely from drug use. Will monitor for metabolization until suspected discharge once more awake and oriented [DW]  1913 Reassessed patient. Still sleeping at this time and difficult to wake up. Will continue to monitor until awake. No distress. Vital signs stable [DW]  2123 Patient awake and talking to us  at this time.  Denies taking any drugs today but does have history of it.  He states he feels fine at this time and wants to go home.  Will call his family members. [DW]    Clinical Course User Index [DW] Malvina Alm DASEN, MD   Medical Decision Making Amount and/or Complexity of Data Reviewed Labs: ordered. Decision-making details documented in ED Course. Radiology: ordered.  Risk Prescription drug management.   Received patient in signout.  43 year old male presenting with police department after being found combative and rolling around on the sidewalk.  Unclear about any substance use.  Previous provider spoke with family member who states alcohol and other drug use but cannot specify what.  Originally given droperidol .  Initial labs showed mild CK elevation at 915, slight AKI at 1.65 and mild leukocytosis at 13.8.  Given 1 L fluid.  Additional family member came to bedside and stated he also uses cocaine and methamphetamines.  Patient slowly waking up and we will continue to monitor.  Given first troponin is elevated following use of meth and cocaine,  will get second troponin for trend.  Second troponin consistent with first and likely from drug use.  No indication to repeat.  Patient was assessed for several hours in the ED before he eventually woke up.  He denies any acute complaints at this time he is denying drug use although family does state this is likely what happened.  He otherwise feels well and wishes to go home.  Patient has remained stable throughout with normal vital signs and no other acute complaints.  Will discharge and recommended hydration and abstinence from drugs.     Malvina Alm DASEN, MD 02/18/24 2142

## 2024-02-18 NOTE — ED Notes (Signed)
 Voicemail left with pt's brother stating he is ready to be picked up.
